# Patient Record
Sex: Male | Born: 1975 | Race: White | Hispanic: No | Marital: Married | Smoking: Current every day smoker
Health system: Southern US, Community
[De-identification: ages and names within clinical notes are randomized; demographics above are authoritative.]

## PROBLEM LIST (undated history)

## (undated) DIAGNOSIS — M549 Dorsalgia, unspecified: Secondary | ICD-10-CM

## (undated) HISTORY — PX: TONSILLECTOMY: SUR1361

## (undated) HISTORY — PX: ABDOMINAL SURGERY: SHX537

---

## 2007-04-20 ENCOUNTER — Ambulatory Visit: Payer: Self-pay | Admitting: Family Medicine

## 2008-03-11 ENCOUNTER — Ambulatory Visit: Payer: Self-pay | Admitting: Internal Medicine

## 2008-05-31 ENCOUNTER — Emergency Department: Payer: Self-pay | Admitting: Emergency Medicine

## 2008-10-12 ENCOUNTER — Ambulatory Visit: Payer: Self-pay | Admitting: Internal Medicine

## 2009-01-24 ENCOUNTER — Emergency Department: Payer: Self-pay | Admitting: Emergency Medicine

## 2009-07-21 ENCOUNTER — Ambulatory Visit: Payer: Self-pay | Admitting: Internal Medicine

## 2011-07-03 IMAGING — CT CT ABD-PELV W/ CM
1 of 2 series · 15 of 32 positions shown, 19 images · non-contrast
Comparison: none

REASON FOR EXAM: (1) abd pain rmq; (2) abd pain rmq
COMMENTS:   May transport without cardiac monitor

PROCEDURE:     CT  - CT ABDOMEN / PELVIS  W  - January 24, 2009  [DATE]
RESULT:
TECHNIQUE: Helical 3 mm sections were obtained from the lung bases through
the pubic symphysis status post intravenous administration of 100 ml of
[id number].

[Series 2: soft tissue · axial · 0.88mm/px · z∈[-829,-349]mm · 15 of 174 slices shown, 19 images]
[im 7/174  soft-tissue]
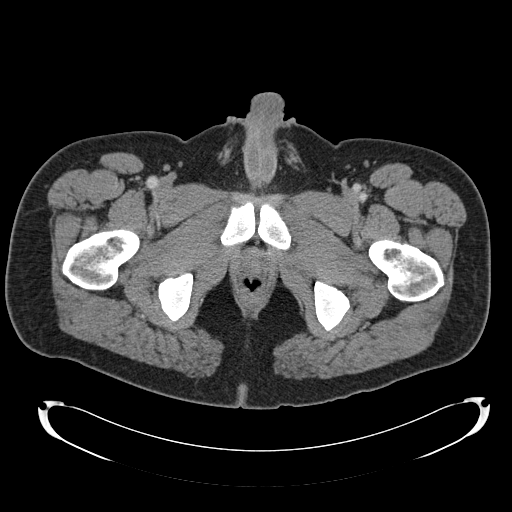
[im 7/174  bone]
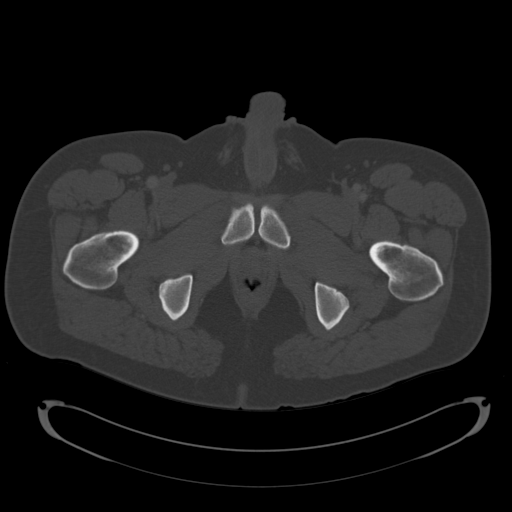
[im 20/174  soft-tissue]
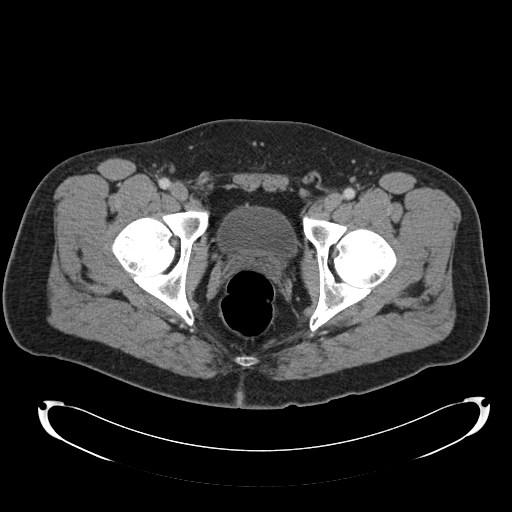
[im 34/174  soft-tissue]
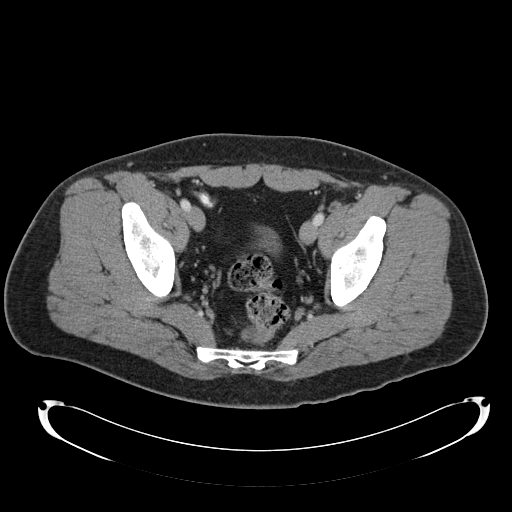
[im 47/174  soft-tissue]
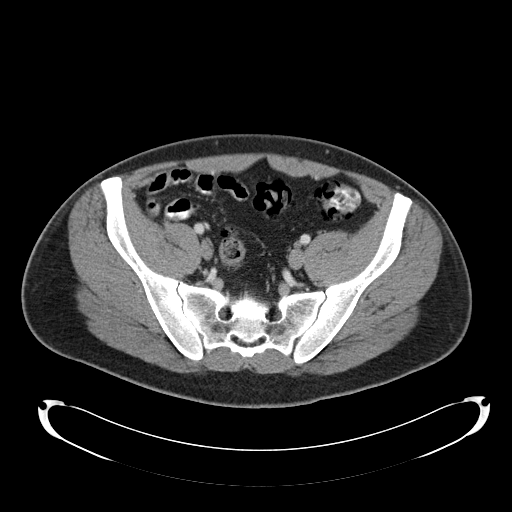
[im 60/174  soft-tissue]
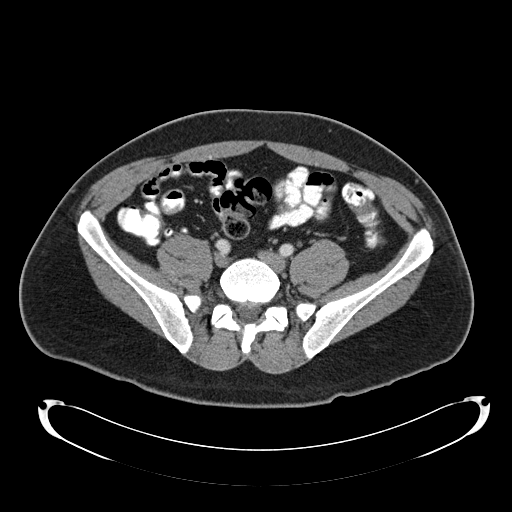
[im 74/174  soft-tissue]
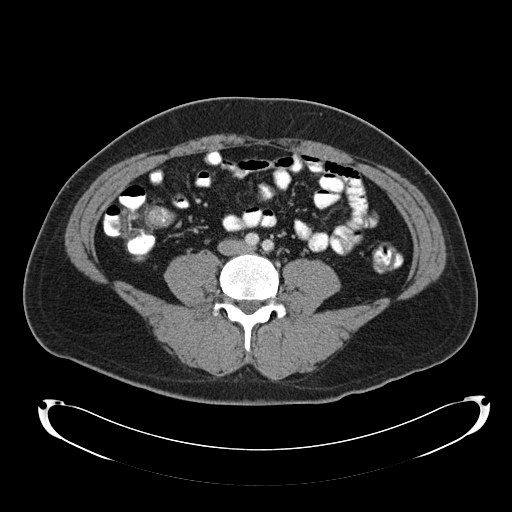
[im 87/174  soft-tissue]
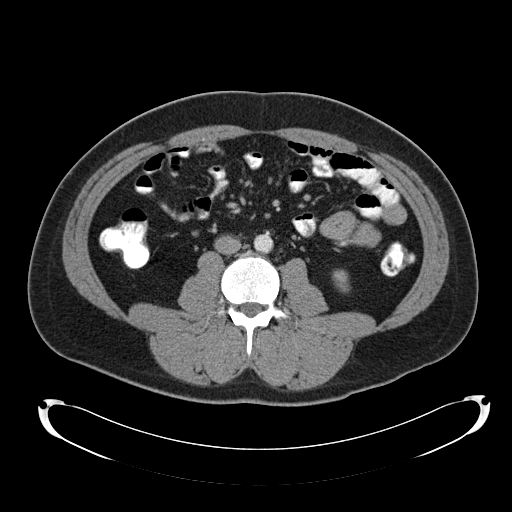
[im 100/174  soft-tissue]
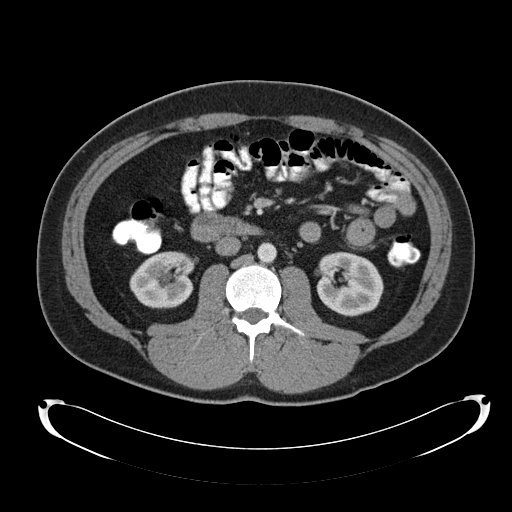
[im 114/174  soft-tissue]
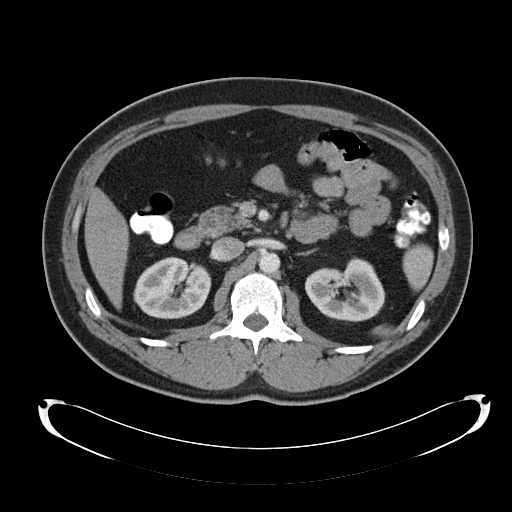
[im 114/174  bone]
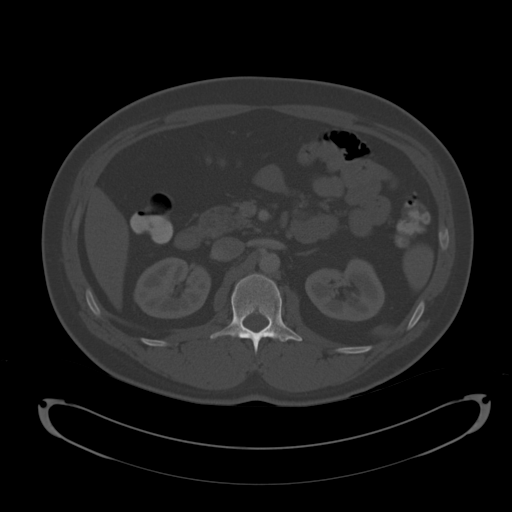
[im 127/174  soft-tissue]
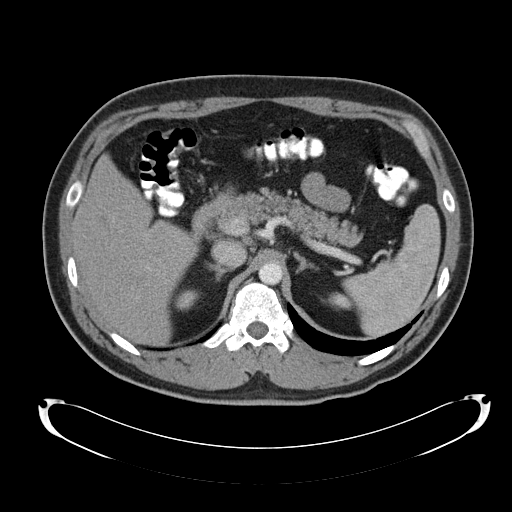
[im 140/174  soft-tissue]
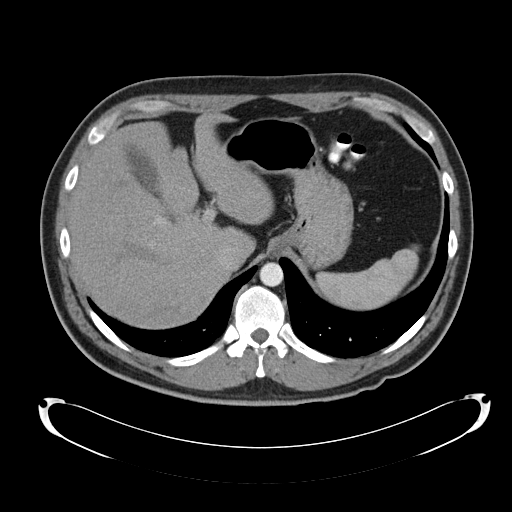
[im 147/174  lung]
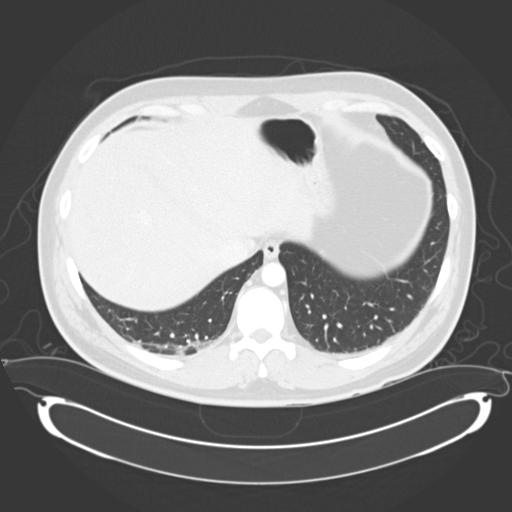
[im 154/174  soft-tissue]
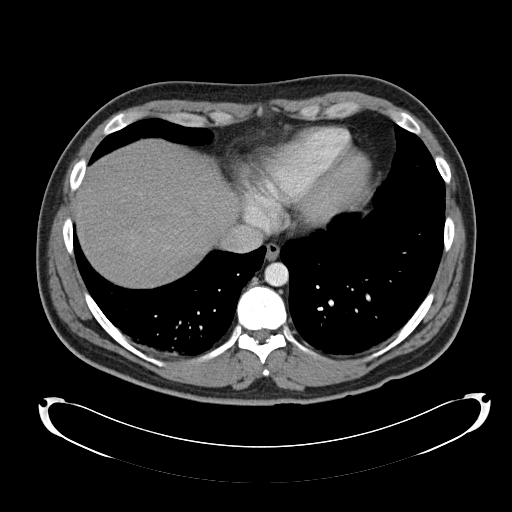
[im 154/174  lung]
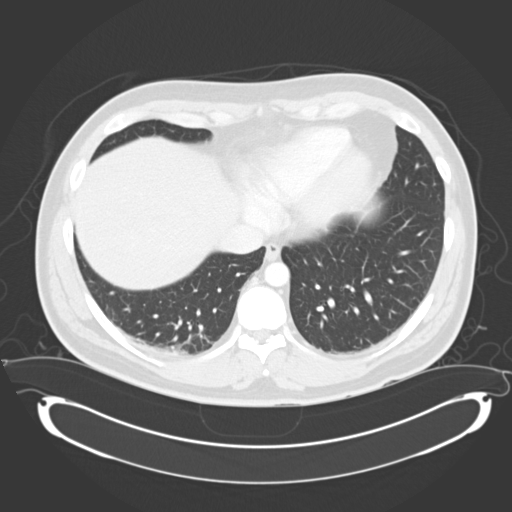
[im 160/174  lung]
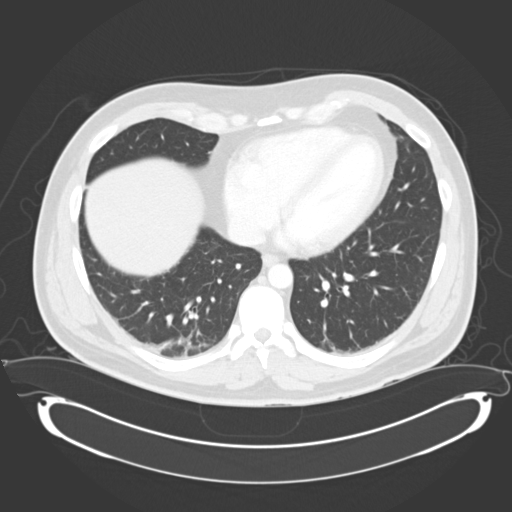
[im 167/174  soft-tissue]
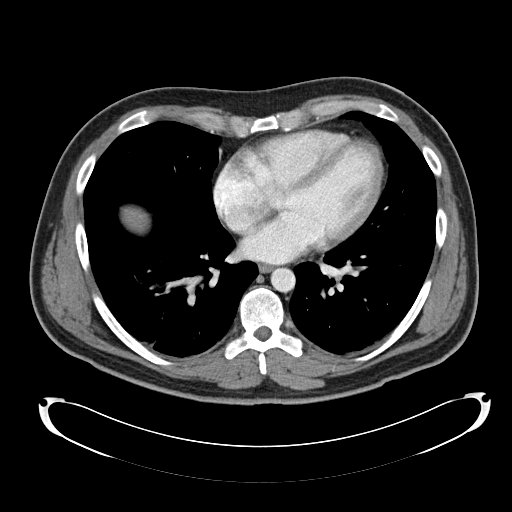
[im 167/174  lung]
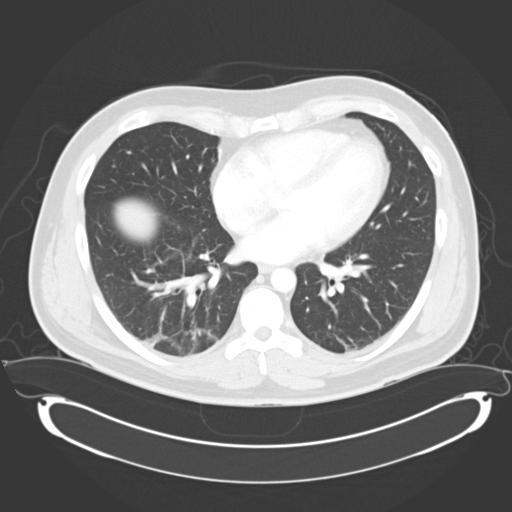

[15 of 32 positions shown; findings below may reference images not displayed]

FINDINGS: Evaluation of the lung bases demonstrates minimal hypoventilation.

The liver, spleen, adrenals, pancreas and kidneys are unremarkable. A small,
1 cm nodular density projects just anteriorly to the apex of the spleen.
This likely represents a small splenule in that it demonstrates
iso-attenuation with the spleen.

No further evidence of abdominal masses, free fluid or loculated fluid
collections is appreciated. Multiple, small lymph nodes are appreciated
scattered throughout the mesenteric fat. Small, subcentimeter,
retroperitoneal lymph nodes are appreciated. Small, subcentimeter lymph
nodes are identified within the pelvis and within the right and left
inguinal regions, retroperitoneal regions, pelvis and inguinal regions.
There is no evidence of abdominal or pelvic masses, free fluid or loculated
fluid collections. There is no CT evidence of bowel obstruction, enteritis,
colitis, diverticulitis or appendicitis. The appendix is identified and is
unremarkable. There is no evidence of an abdominal aortic aneurysm. The
celiac, SMA, IMA and portal vein are opacified.
IMPRESSION: Multiple, small lymph nodes throughout the mesenteric fat
and retroperitoneal regions as well as within the pelvis and inguinal
regions. Differential considerations are etiologies such as granulomatous
disorders or possibly even granulomatous infections such as tuberculosis,
particularly if clinically appropriate. Lymphoproliferative disorders are
also of differential consideration, if clinically appropriate. An etiology
such as mononucleosis can be considered. No further focal or acute
abnormalities are identified.

## 2011-12-28 IMAGING — CR DG HIP COMPLETE 2+V*L*
1 series · 3 of 3 positions shown · non-contrast
Comparison: none

REASON FOR EXAM: 2 wk hx pain L hip; no trauma
COMMENTS:

PROCEDURE:     MDR - MDR HIP LEFT COMPLETE  - July 21, 2009 [DATE]
RESULT:     No fracture, dislocation or other acute bony abnormality is
identified. The hip joint space is well maintained. No lytic or blastic
changes are seen.

[Series 1: view not recorded · 0.17mm/px · 3 of 3 slices shown]
[im 1/3]
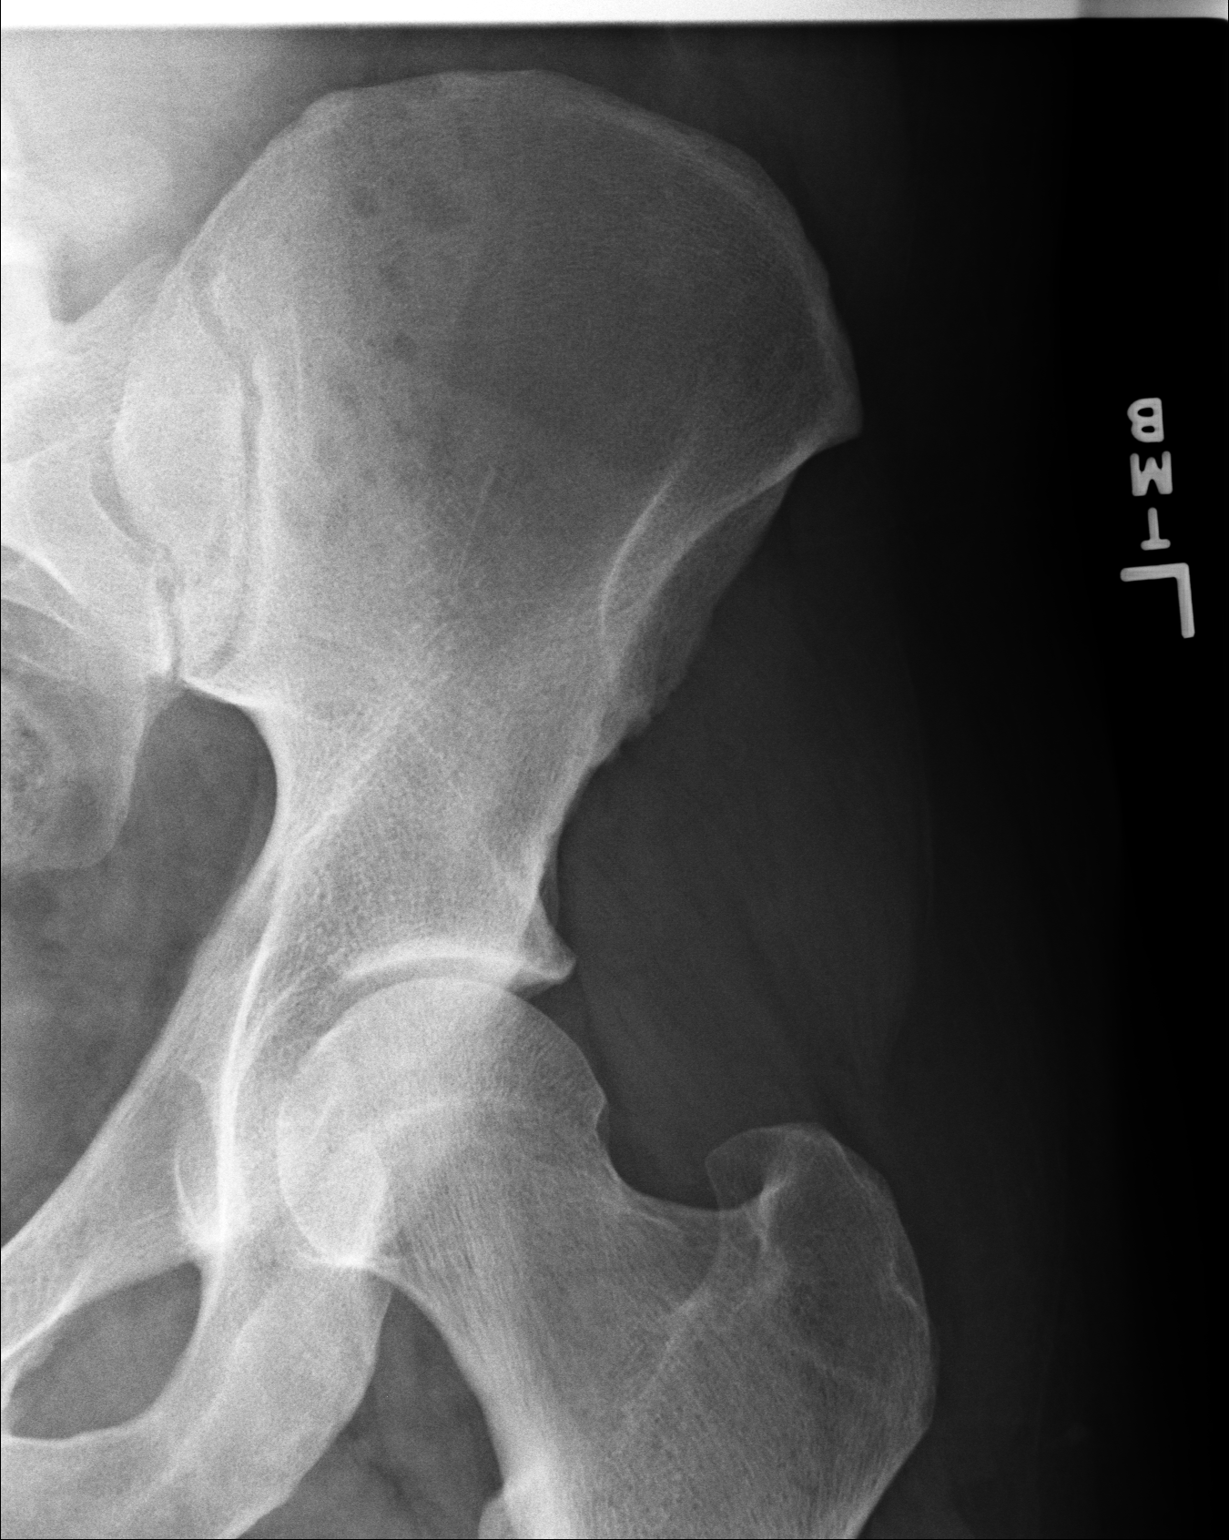
[im 2/3]
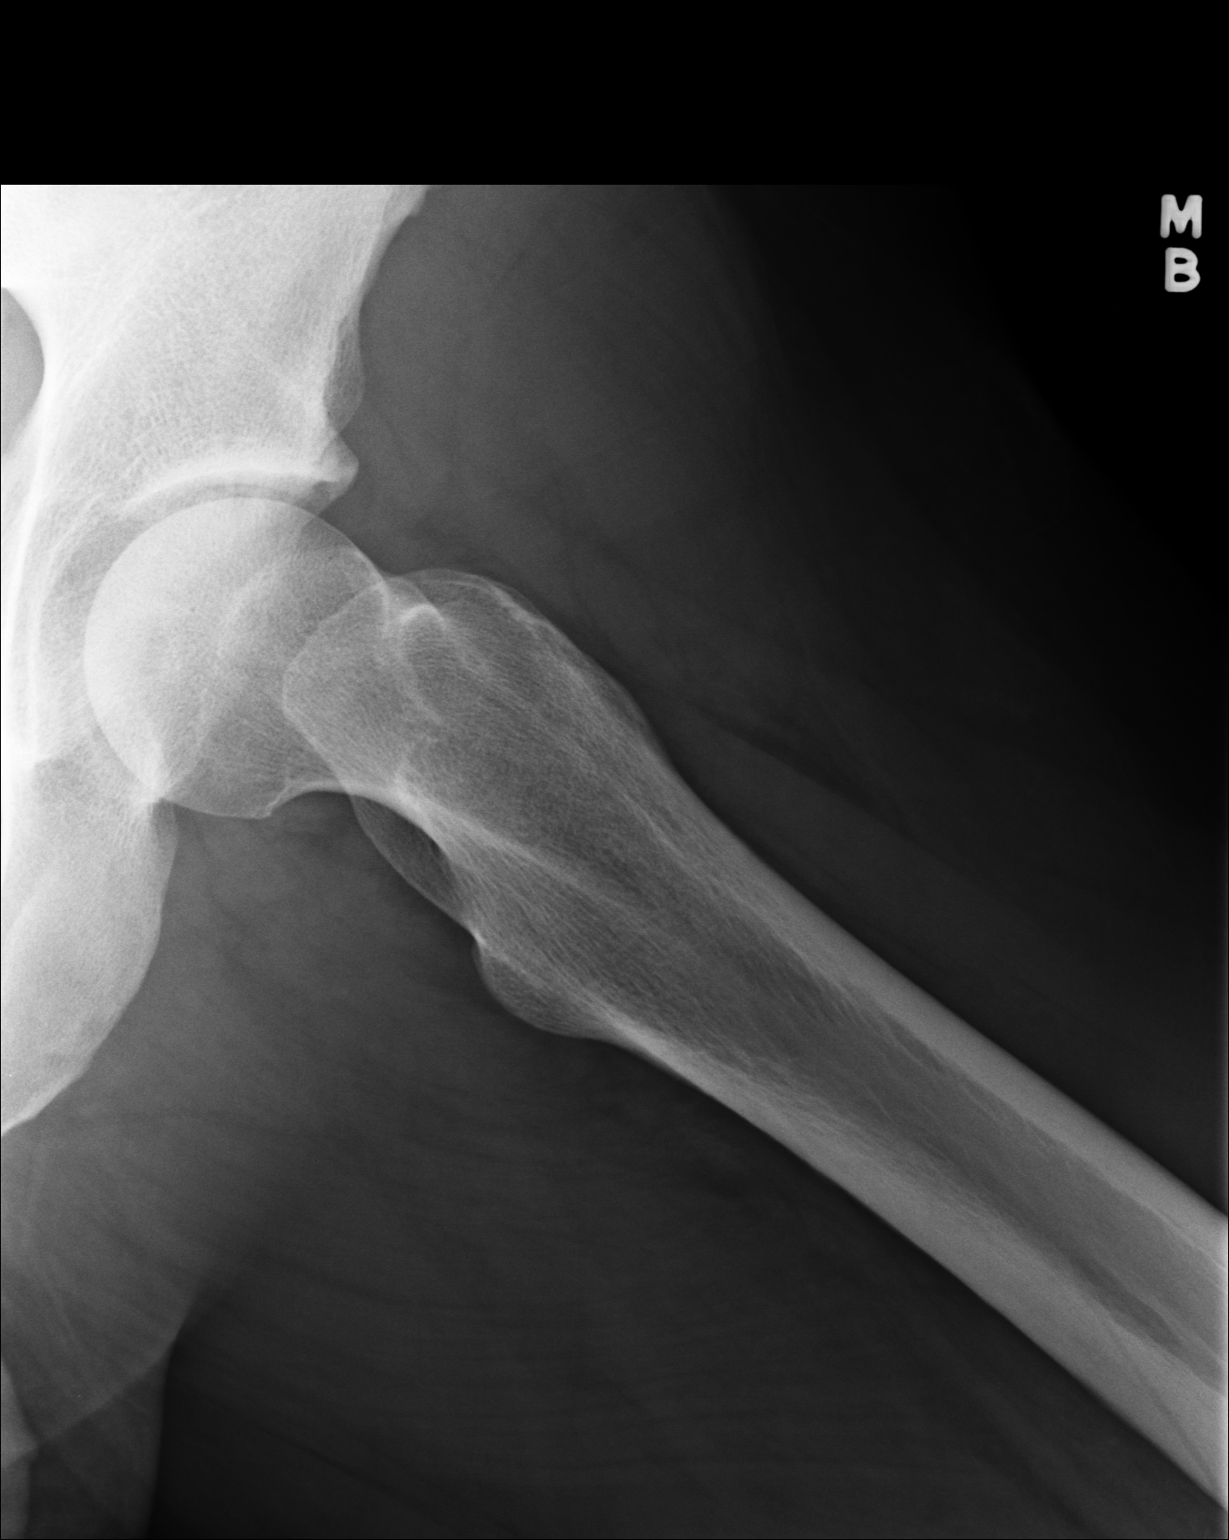
[im 3/3]
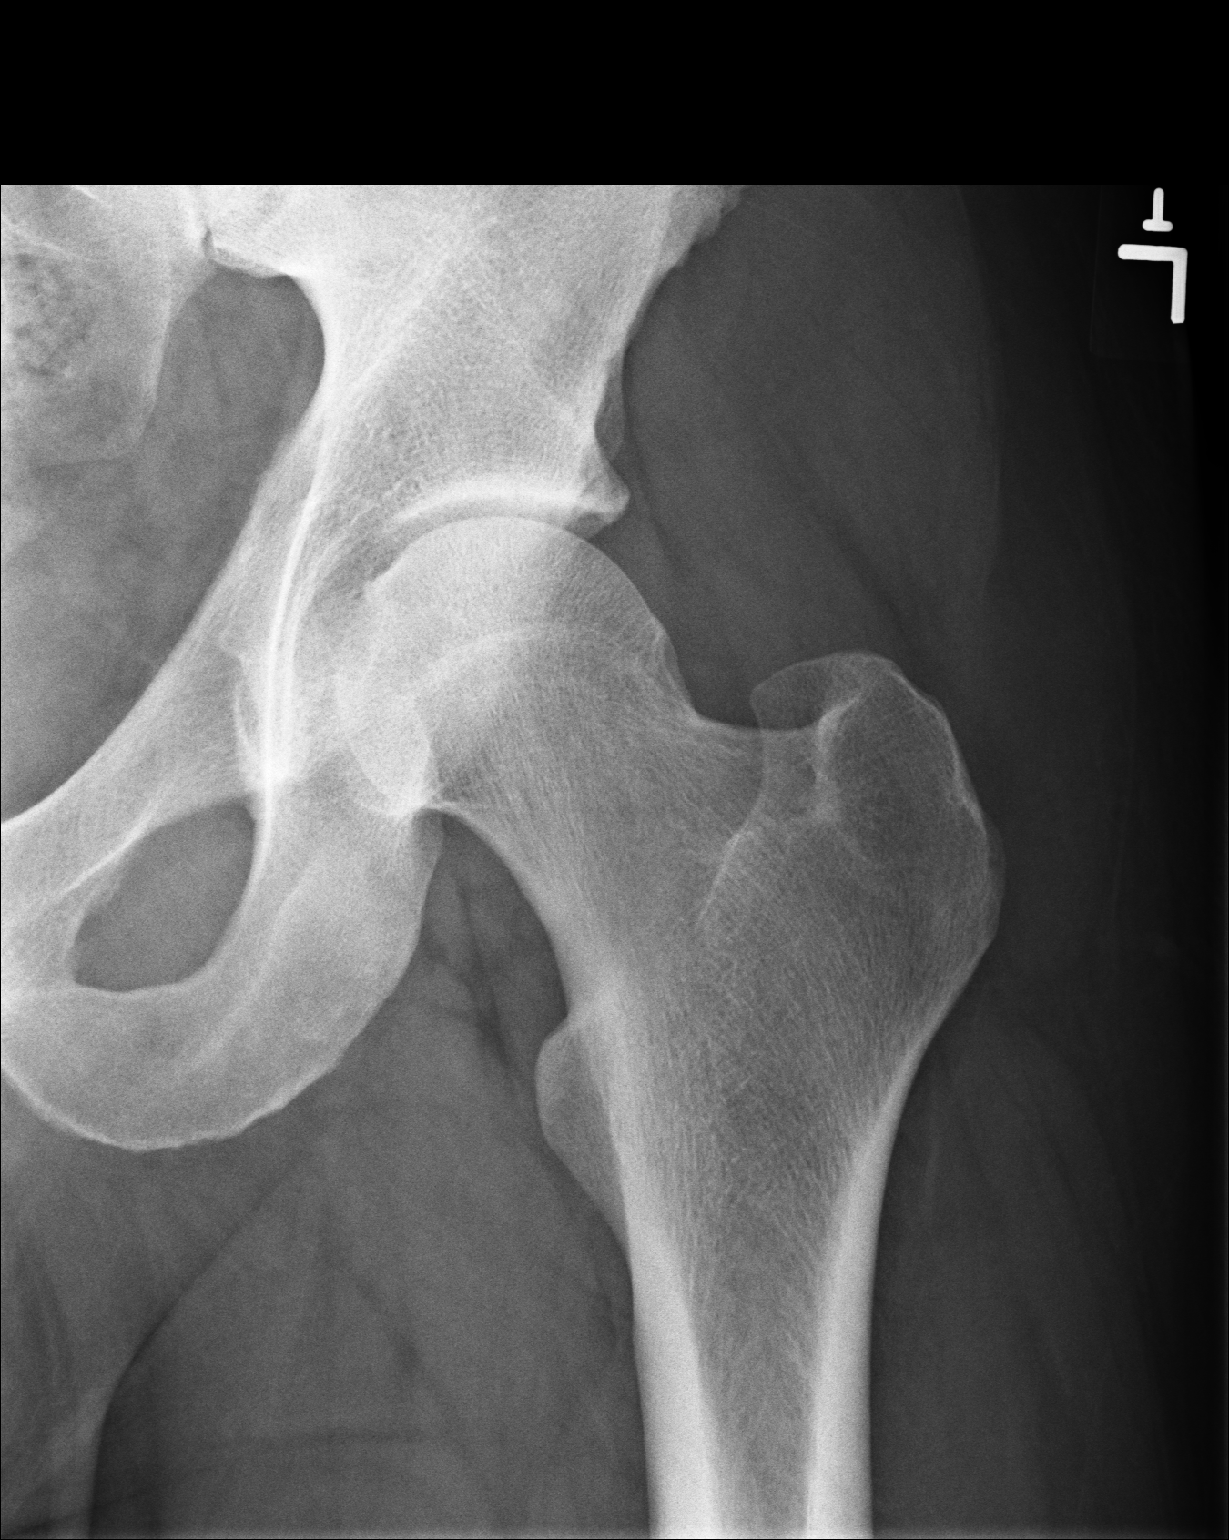

[3 of 3 positions shown; findings below may reference images not displayed]

IMPRESSION: No significant abnormalities are noted.

## 2014-05-13 ENCOUNTER — Ambulatory Visit
Admit: 2014-05-13 | Disposition: A | Discharge status: Home / Self Care | Payer: Self-pay | Attending: Family Medicine | Admitting: Family Medicine

## 2015-01-11 ENCOUNTER — Ambulatory Visit
Admission: EM | Admit: 2015-01-11 | Discharge: 2015-01-11 | Disposition: A | Discharge status: Home / Self Care | Payer: BLUE CROSS/BLUE SHIELD | Attending: Family Medicine | Admitting: Family Medicine

## 2015-01-11 DIAGNOSIS — R079 Chest pain, unspecified: Secondary | ICD-10-CM | POA: Diagnosis present

## 2015-01-11 DIAGNOSIS — F410 Panic disorder [episodic paroxysmal anxiety] without agoraphobia: Secondary | ICD-10-CM | POA: Diagnosis not present

## 2015-01-11 DIAGNOSIS — R0789 Other chest pain: Secondary | ICD-10-CM | POA: Diagnosis not present

## 2015-01-11 DIAGNOSIS — F419 Anxiety disorder, unspecified: Secondary | ICD-10-CM | POA: Diagnosis not present

## 2015-01-11 DIAGNOSIS — F17210 Nicotine dependence, cigarettes, uncomplicated: Secondary | ICD-10-CM | POA: Insufficient documentation

## 2015-01-11 HISTORY — DX: Dorsalgia, unspecified: M54.9

## 2015-01-11 LAB — TROPONIN I: Troponin I: <0.03 ng/mL (ref <0.031)

## 2015-01-11 MED ORDER — NAPROXEN 500 MG PO TABS: 500.0000 mg | ORAL_TABLET | Freq: Two times a day (BID) | ORAL | Status: AC

## 2015-01-11 MED ORDER — ALPRAZOLAM 0.5 MG PO TABS
0.5000 mg | ORAL_TABLET | Freq: Every evening | ORAL | Status: AC | PRN
Start: 2015-01-11 — End: ?

## 2015-01-11 NOTE — ED Notes (Signed)
Started last evening with mid sternal chest pain radiating right anterior and through to between shoulder blades. Also states left arm was tingling and continues to tingle Felt "hot" and had slight SOB then.

## 2015-01-11 NOTE — ED Notes (Signed)
Checked with Lab. Is starting Labs now.

## 2015-01-11 NOTE — ED Provider Notes (Signed)
CSN: [id number]     Arrival date & time 01/11/15  1038 History   First MD Initiated Contact with Patient 01/11/15 1142     Chief Complaint  Patient presents with  . Chest Pain   (Consider location/radiation/quality/duration/timing/severity/associated sxs/prior Treatment) HPI   This a 39 year old male who is accompanied by his wife complaining of retrosternal pain feels mostly right anterior with radiation through between his interscapular area. Also he was was having left arm numbness and tingling in involving the middle ring and little fingers that was very numb last night but has just some residual numbness today in the same distribution. He has no history of coronary disease but has a very strong family history of coronary disease in uncles. He has very little knowledge of his father but he does know that he has some "heart issues". He's had several uncles who have died in their 50s from CAD. Last night the back pain began around 6:30 7:00 when he stood up and he felt the pain in his sternum and just right of the sternum with radiation through his chest into the interscapular area. He had no diaphoresis denies any nausea or vomiting but does remember some mild shortness of breath. He states that he has been having a very stressful time at work has been traveling a great deal. His wife became alarmed and insisted that he be seen today. He seems to think it is mostly from stress and fatigue sees had a very brief couple months with extensive travel required at his job. He usually works out of his house. When questioned he did admit to lifting several heavy items yesterday weighing up to 75 pounds each of large drums as well as a heavy guitar. He felt no pain immediately. An EKG was performed immediately on his being seen today and it shows a normal sinus rhythm with no acute changes. He states that yesterday when his pain was at its worse was an 8 or 9 out of 10 and now he characterizes it as a 4-6 out of  10 and is worse with deep breathing or certain movements of his upper torso  Past Medical History  Diagnosis Date  . Back pain    Past Surgical History  Procedure Laterality Date  . Tonsillectomy    . Abdominal surgery     Family History  Problem Relation Age of Onset  . Heart failure Father    Social History  Substance Use Topics  . Smoking status: Current Every Day Smoker -- 0.50 packs/day    Types: Cigarettes  . Smokeless tobacco: None  . Alcohol Use: Yes     Comment: socially    Review of Systems  Constitutional: Positive for activity change. Negative for fever, chills, diaphoresis, appetite change and fatigue.  Respiratory: Positive for shortness of breath.   Cardiovascular: Positive for chest pain. Negative for palpitations and leg swelling.  Musculoskeletal: Positive for back pain.  Neurological: Positive for numbness.  All other systems reviewed and are negative.   Allergies  Prednisone  Home Medications   Prior to Admission medications   Medication Sig Start Date End Date Taking? Authorizing Provider  oxyCODONE (OXY IR/ROXICODONE) 5 MG immediate release tablet Take 5 mg by mouth every 4 (four) hours as needed for severe pain.   Yes Historical Provider, MD  ALPRAZolam Prudy Feeler) 0.5 MG tablet Take 1 tablet (0.5 mg total) by mouth at bedtime as needed for anxiety. 01/11/15   Lutricia Feil, PA-C  naproxen (NAPROSYN) 500  MG tablet Take 1 tablet (500 mg total) by mouth 2 (two) times daily with a meal. 01/11/15   Lutricia Feil, PA-C   Meds Ordered and Administered this Visit  Medications - No data to display  BP 123/79 mmHg  Pulse 65  Temp(Src) 97.8 F (36.6 C) (Tympanic)  Resp 16  Ht  (1.93 m)  Wt 240 lb (108.863 kg)  BMI 29.23 kg/m2  SpO2 100% No data found.   Physical Exam  Constitutional: He is oriented to person, place, and time. He appears well-developed and well-nourished. No distress.  HENT:  Head: Normocephalic and atraumatic.  Eyes:  Conjunctivae are normal. Pupils are equal, round, and reactive to light.  Neck: Normal range of motion. Neck supple.  Range of motion is normal and comfortable in all planes. There is no tenderness of the trapezii bilaterally. Upper extremity strength is intact. There is mild hip esthesia to light touch involving the left middle index and ring fingers.  Cardiovascular: Normal rate, regular rhythm and normal heart sounds.  Exam reveals no gallop and no friction rub.   No murmur heard. Pulmonary/Chest: Effort normal and breath sounds normal. No respiratory distress. He has no wheezes. He has no rales. He exhibits tenderness.  There is tenderness to palpation with production of his symptoms to AP compression along the sternum. There is no costochondral tenderness present. He complains of reproduction of his symptoms with deep breathing and motion of his upper torso.  Musculoskeletal: Normal range of motion. He exhibits tenderness. He exhibits no edema.  Referred to pulmonary chest same  Lymphadenopathy:    He has no cervical adenopathy.  Neurological: He is alert and oriented to person, place, and time. No cranial nerve deficit. He exhibits normal muscle tone. Coordination normal.  Skin: Skin is warm and dry. No rash noted. He is not diaphoretic. No erythema. No pallor.  Psychiatric: He has a normal mood and affect. His behavior is normal. Judgment and thought content normal.  Nursing note and vitals reviewed.   ED Course  Procedures (including critical care time)  Labs Review Labs Reviewed  TROPONIN I    Imaging Review No results found.   Visual Acuity Review  Right Eye Distance:   Left Eye Distance:   Bilateral Distance:    Right Eye Near:   Left Eye Near:    Bilateral Near:     ED ECG REPORT   Date: 01/11/2015  EKG Time: 2:22 PM  Rate: 60  Rhythm: normal sinus rhythm,  normal EKG, normal sinus rhythm,No acute changes  Axis: -1,-8,3  Intervals:none  ST&T Change:None  Narrative Interpretation: Normal sinus rhythm without acute changes.      I have personally have read and agree with the machine reading    Patient has chest wall tenderness reproducing symptoms exactly as he is experiencing. Negative troponin after 18 hours of chest pain and normal EKG makes cardiac source unlikely. Supported with H/O lifting heavy objects prior to CW pain more consistant with musculoskeletal cause.  MDM   1. Chest pain, musculoskeletal   2. Anxiety attack    Discharge Medication List as of 01/11/2015  1:17 PM    START taking these medications   Details  ALPRAZolam (XANAX) 0.5 MG tablet Take 1 tablet (0.5 mg total) by mouth at bedtime as needed for anxiety., Starting 01/11/2015, Until Discontinued, Print    naproxen (NAPROSYN) 500 MG tablet Take 1 tablet (500 mg total) by mouth 2 (two) times daily with a meal., Starting  01/11/2015, Until Discontinued, Normal      Plan: 1. Test/x-ray results and diagnosis reviewed with patient 2. rx as per orders; risks, benefits, potential side effects reviewed with patient 3. Recommend supportive treatment with rest and symptom avoidance. If symptoms increase , call 911 and go to ED.  4. F/u PCP for anxiety /stress issues.     Lutricia FeilWilliam P Maleeya Peterkin, PA-C 01/11/15 1425

## 2015-01-11 NOTE — ED Notes (Signed)
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 Color pink, skin warm and dry

## 2015-01-11 NOTE — ED Notes (Signed)
Family at bedside.Remains anxious and wringing hands. Still not talking to wife
